# Patient Record
Sex: Male | Born: 1995 | Race: White | Hispanic: No | Marital: Single | Smoking: Never smoker
Health system: Southern US, Community
[De-identification: ages and names within clinical notes are randomized; demographics above are authoritative.]

## PROBLEM LIST (undated history)

## (undated) DIAGNOSIS — F840 Autistic disorder: Secondary | ICD-10-CM

## (undated) DIAGNOSIS — F909 Attention-deficit hyperactivity disorder, unspecified type: Secondary | ICD-10-CM

## (undated) DIAGNOSIS — R011 Cardiac murmur, unspecified: Secondary | ICD-10-CM

---
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
## 2015-01-02 ENCOUNTER — Encounter: Payer: Self-pay | Admitting: Occupational Therapy

## 2015-01-02 ENCOUNTER — Ambulatory Visit: Payer: Medicaid Other | Admitting: Occupational Therapy

## 2015-01-02 DIAGNOSIS — M25642 Stiffness of left hand, not elsewhere classified: Secondary | ICD-10-CM

## 2015-01-02 DIAGNOSIS — R29898 Other symptoms and signs involving the musculoskeletal system: Secondary | ICD-10-CM

## 2015-01-02 NOTE — Therapy (Signed)
[facility] [address] [place], [place], [address] Phone: [phone]   Fax:  [phone]  Occupational Therapy Treatment  Patient Details  Name: Albert Hamilton MRN: [id number] Date of Birth: 1995-05-27 No Data Recorded  Encounter Date: 01/02/2015      OT End of Session - 01/02/15 1050    Visit Number 5   Number of Visits 8   Authorization Type MCD   Authorization Time Period 8 visits approved  through 01/07/15   OT Start Time 1015   OT Stop Time 1045   OT Time Calculation (min) 30 min   Activity Tolerance Patient tolerated treatment well      Past Medical History  Diagnosis Date  . Autism   . ADHD (attention deficit hyperactivity disorder)   . Heart murmur     Past Surgical History  Procedure Laterality Date  . S/p orif lt small metacarpal 10/25/14 Left 10/25/14    There were no vitals filed for this visit.  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 Yes   Pain Score 4   only with exercises   Pain Location Hand   Pain Orientation Left   Pain Descriptors / Indicators Aching   Pain Type Acute pain   Pain Onset More than a month ago   Pain Frequency Intermittent   Aggravating Factors  P/ROM, strengthening, cold weather   Pain Relieving Factors rest, heat            [facility] OT Assessment - 01/02/15 0001    Left Hand AROM   L Ring  MCP 0-90 68 Degrees   L Little  MCP 0-90 73 Degrees   Hand Function   Right Hand Grip (lbs) 87 lbs   Left Hand Grip (lbs) 65 lbs                  OT Treatments/Exercises (OP) - 01/02/15 0001    ADLs   ADL Comments Assessed remaining STG and LTG's and discussed results with patient (see goals). Re-assessed grip strength and ROM with  improvements in both - see assessment   Hand Exercises   Other Hand Exercises Gripper set at 55 lbs resistance to pick up blocks for sustained grip strength, but with compensations (picking up on diagonal) due to max drops and frustration w/o compensation. Updated putty to green resistance and issued; pt performed mass grasp and intrinsic + pinch x 10 reps each.                   OT Short Term Goals - 01/02/15 1049    OT SHORT TERM GOAL #1   Title Independent w/ splint wear and care after 2 weeks wear to ensure proper fit   Baseline issued, may need adjustments/modifications   Time 4   Period Weeks   Status Achieved   OT SHORT TERM GOAL #2   Title Independent with initial HEP   Baseline issued, but will need updates   Time 4   Period Weeks   Status Achieved   OT SHORT TERM GOAL #3   Title Pain less than or equal to 5/10 with exercises   Baseline 4/10 with exercises, no pain at rest  Time 4   Period Weeks   Status Achieved           OT Long Term Goals - 01/02/15 1049    OT LONG TERM GOAL #1   Title Independent w/ updated strengthening HEP    Baseline Dependent d/t current precautions   Time 8   Period Weeks   Status Achieved   OT LONG TERM GOAL #2   Title Pt to demo full composite flexion and extension Lt hand for gripping, grasping and releasing   Baseline approx. 50% flexion, 90% extension   Time 8   Period Weeks   Status Achieved  full flexion and extension   OT LONG TERM GOAL #3   Title Pt to return to using Lt hand as assist for bilateral tasks   Baseline dependent d/t current precautions   Time 8   Period Weeks   Status Achieved   OT LONG TERM GOAL #4   Title Grip strength Lt hand to be 25 lbs or greater    Baseline unable to assess d/t current precautions   Time 8   Period Weeks   Status Achieved  12/20/14: Lt = 50-55 lbs (Rt = 80 lbs). 01/02/15: Lt = 65 lbs (Rt = 87 lbs)               Plan - 01/02/15 1051    Clinical Impression  Statement Pt met all STG's and LTG's. Pt progressing with ROM and strength Lt hand   Plan D/C O.T.    OT Home Exercise Plan 11/02/14: splint wear and care, precautions, and initial A/ROM per protocol; 11/24/14 P/ROM HEP; 12/13/14 Strengthening HEP   Consulted and Agree with Plan of Care Patient       OCCUPATIONAL THERAPY DISCHARGE SUMMARY  Visits from Start of Care: 5  Current functional level related to goals / functional outcomes: SEE ABOVE - Pt met all goals   Remaining deficits: Mild pain Mild Lt hand weakness   Education / Equipment: Splint wear and care, HEP's  Plan: Patient agrees to discharge.  Patient goals were met. Patient is being discharged due to meeting the stated rehab goals.  ?????       Problem List There are no active problems to display for this patient.   Carey Bullocks, OTR/L 01/02/2015, 10:53 AM  [facility] [address] [place], [place], [address] Phone: [phone]   Fax:  [phone]  Name: Albert Hamilton MRN: [id number] Date of Birth: 1995-05-22

## 2016-02-10 ENCOUNTER — Encounter (HOSPITAL_COMMUNITY): Payer: Self-pay | Admitting: *Deleted

## 2016-02-10 ENCOUNTER — Emergency Department (HOSPITAL_COMMUNITY)
Admission: EM | Admit: 2016-02-10 | Discharge: 2016-02-10 | Disposition: A | Discharge status: Home / Self Care | Payer: Worker's Compensation | Attending: Emergency Medicine | Admitting: Emergency Medicine

## 2016-02-10 DIAGNOSIS — F909 Attention-deficit hyperactivity disorder, unspecified type: Secondary | ICD-10-CM | POA: Diagnosis not present

## 2016-02-10 DIAGNOSIS — W268XXA Contact with other sharp object(s), not elsewhere classified, initial encounter: Secondary | ICD-10-CM | POA: Insufficient documentation

## 2016-02-10 DIAGNOSIS — S61032A Puncture wound without foreign body of left thumb without damage to nail, initial encounter: Secondary | ICD-10-CM | POA: Diagnosis not present

## 2016-02-10 DIAGNOSIS — Y9389 Activity, other specified: Secondary | ICD-10-CM | POA: Diagnosis not present

## 2016-02-10 DIAGNOSIS — F840 Autistic disorder: Secondary | ICD-10-CM | POA: Diagnosis not present

## 2016-02-10 DIAGNOSIS — Y990 Civilian activity done for income or pay: Secondary | ICD-10-CM | POA: Diagnosis not present

## 2016-02-10 DIAGNOSIS — T148XXA Other injury of unspecified body region, initial encounter: Secondary | ICD-10-CM

## 2016-02-10 DIAGNOSIS — Y9289 Other specified places as the place of occurrence of the external cause: Secondary | ICD-10-CM | POA: Diagnosis not present

## 2016-02-10 DIAGNOSIS — S6992XA Unspecified injury of left wrist, hand and finger(s), initial encounter: Secondary | ICD-10-CM | POA: Diagnosis present

## 2016-02-10 LAB — RAPID HIV SCREEN (HIV 1/2 AB+AG)
HIV 1/2 ANTIBODIES: NONREACTIVE
HIV-1 P24 Antigen - HIV24: NONREACTIVE

## 2016-02-10 NOTE — ED Provider Notes (Signed)
[facility] DEPT Provider Note   CSN: [id number] Arrival date & time: 02/10/16  0156     History   Chief Complaint Chief Complaint  Patient presents with  . Body Fluid Exposure    HPI Albert Hamilton is a 20 y.o. male.  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 He felt a poke through his glove on his left thumb. He noticed a small puncture wound and reported it to his supervisor. Supervisor states that needles are sometimes found in the plastic but no needle was seen today. Patient is unsure what poked him. There has not been any bleeding. His tetanus shot is up-to-date.   The history is provided by the patient.  Body Fluid Exposure    Past Medical History:  Diagnosis Date  . ADHD (attention deficit hyperactivity disorder)   . Autism   . Heart murmur     There are no active problems to display for this patient.   Past Surgical History:  Procedure Laterality Date  . s/p ORIF Lt small metacarpal 10/25/14 Left 10/25/14       Home Medications    Prior to Admission medications   Medication Sig Start Date End Date Taking? Authorizing Provider  amoxicillin-clavulanate (AUGMENTIN) 875-125 MG per tablet Take 1 tablet by mouth 2 (two) times daily. 10/20/14   Morven N Rumley, DO  FLUoxetine (PROZAC) 10 MG capsule Take 10 mg by mouth daily.    Historical Provider, MD  Melatonin 3 MG TABS Take 1 tablet by mouth daily as needed (sleep).     Historical Provider, MD  oxyCODONE-acetaminophen (PERCOCET/ROXICET) 5-325 MG per tablet Take 1-2 tablets by mouth every 6 (six) hours as needed for moderate pain. 10/20/14   Araceli Bouchealeigh N Rumley, DO    Family History Family History  Problem Relation Age of Onset  . Asthma Mother   . Asthma Father   . Diabetes Other   . Asthma Other     Social History Social History  Substance Use Topics  . Smoking status: Never Smoker  . Smokeless tobacco: Never Used  . Alcohol use No     Allergies   Patient has no known  allergies.   Review of Systems Review of Systems  Constitutional: Negative for activity change, appetite change and fever.  HENT: Negative for congestion and rhinorrhea.   Respiratory: Negative for cough, chest tightness and shortness of breath.   Gastrointestinal: Negative for abdominal pain, nausea and vomiting.  Genitourinary: Negative for dysuria and hematuria.  Musculoskeletal: Negative for arthralgias and myalgias.  Skin: Positive for wound.  Neurological: Negative for dizziness and light-headedness.  A complete 10 system review of systems was obtained and all systems are negative except as noted in the HPI and PMH.     Physical Exam Updated Vital Signs BP 139/79   Pulse 88   Temp 98.5 F (36.9 C) (Oral)   Resp 20   Ht 5\' 11"  (1.803 m)   Wt 287 lb (130.2 kg)   SpO2 100%   BMI 40.03 kg/m   Physical Exam  Constitutional: He is oriented to person, place, and time. He appears well-developed and well-nourished. No distress.  HENT:  Head: Normocephalic and atraumatic.  Mouth/Throat: Oropharynx is clear and moist. No oropharyngeal exudate.  Eyes: Conjunctivae and EOM are normal. Pupils are equal, round, and reactive to light.  Neck: Normal range of motion. Neck supple.  No meningismus.  Cardiovascular: Normal rate, regular rhythm, normal heart sounds and intact distal pulses.   No  murmur heard. Pulmonary/Chest: Effort normal and breath sounds normal. No respiratory distress.  Abdominal: Soft. There is no tenderness. There is no rebound and no guarding.  Musculoskeletal: Normal range of motion. He exhibits no edema or tenderness.  Neurological: He is alert and oriented to person, place, and time. No cranial nerve deficit. He exhibits normal muscle tone. Coordination normal.  No ataxia on finger to nose bilaterally. No pronator drift. 5/5 strength throughout. CN 2-12 intact.Equal grip strength. Sensation intact.   Skin: Skin is warm.  Tiny puncture wound to palmar L thumb.  No bleeding.  Psychiatric: He has a normal mood and affect. His behavior is normal.  Nursing note and vitals reviewed.    ED Treatments / Results  Labs (all labs ordered are listed, but only abnormal results are displayed) Labs Reviewed  RAPID HIV SCREEN (HIV 1/2 AB+AG)  HEPATITIS PANEL, ACUTE    EKG  EKG Interpretation None       Radiology No results found.  Procedures Procedures (including critical care time)  Medications Ordered in ED Medications - No data to display   Initial Impression / Assessment and Plan / ED Course  I have reviewed the triage vital signs and the nursing notes.  Pertinent labs & imaging results that were available during my care of the patient were reviewed by me and considered in my medical decision making (see chart for details).  Clinical Course   Patient poked by unknown object to left thumb. Tetanus is up-to-date. Some concern for possible needle prick but no needle seen.  Risks and benefits of HIV prophylaxis discussed with patient. He declines prophylaxis at this time. is agreeable to obtain body fluid exposure panel.  Advised he should have recheck of HIV and hepatitis panel at PCP's office in 4-6 weeks.   Final Clinical Impressions(s) / ED Diagnoses   Final diagnoses:  Puncture wound    New Prescriptions New Prescriptions   No medications on file     Glynn OctaveStephen Loghan Kurtzman, MD 02/10/16 [phone]

## 2016-02-10 NOTE — Discharge Instructions (Signed)
You declined HIV prophylaxis.  You should followup with your doctor in 4-6 weeks for repeat HIV and hepatitis testing. Return to the ED if you develop new or worsening symptoms.

## 2016-02-10 NOTE — ED Notes (Signed)
ED Provider at bedside. 

## 2016-02-10 NOTE — ED Triage Notes (Addendum)
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

## 2016-02-12 LAB — HEPATITIS PANEL, ACUTE
HCV AB: 0.1 {s_co_ratio} (ref 0.0–0.9)
Hep A IgM: NEGATIVE
Hep B C IgM: NEGATIVE
Hepatitis B Surface Ag: NEGATIVE

## 2016-03-24 ENCOUNTER — Ambulatory Visit (INDEPENDENT_AMBULATORY_CARE_PROVIDER_SITE_OTHER): Payer: Worker's Compensation | Admitting: Family Medicine

## 2016-03-24 ENCOUNTER — Encounter: Payer: Self-pay | Admitting: Family Medicine

## 2016-03-24 VITALS — BP 136/78 | HR 91 | Temp 97.8°F | Ht 71.0 in | Wt 290.0 lb

## 2016-03-24 DIAGNOSIS — S61032A Puncture wound without foreign body of left thumb without damage to nail, initial encounter: Secondary | ICD-10-CM

## 2016-03-24 DIAGNOSIS — W460XXA Contact with hypodermic needle, initial encounter: Secondary | ICD-10-CM

## 2016-03-24 DIAGNOSIS — T148XXA Other injury of unspecified body region, initial encounter: Secondary | ICD-10-CM

## 2016-03-24 NOTE — Patient Instructions (Signed)
Great to meet you!  We will call within 1 week with your lab results.

## 2016-03-24 NOTE — Progress Notes (Signed)
   HPI  Patient presents today or Worker's Compensation follow-up.  Patient was seen in the emergency room on 02/10/2016 for a puncture wound in the left thumb on the same date. There is concern for needle puncture/body fluid exposure. HIV and hepatitis labs were drawn and were negative. Patient has healed completely and returned to work the same day. He's had no limitations due to the injury.  He returns for repeat blood work.   PMH: ADHD, autism, heart murmur Family history positive for asthma in father and mother Surgical history of ORIF of the left small metacarpal No alcohol or drug use- social history ROS: Per HPI  Objective: BP 136/78   Pulse 91   Temp 97.8 F (36.6 C) (Oral)   Ht 5\' 11"  (1.803 m)   Wt 290 lb (131.5 kg)   BMI 40.45 kg/m  Gen: NAD, alert, cooperative with exam HEENT: NCAT CV: RRR, good S1/S2, no murmur Resp: CTABL, no wheezes, non-laboredy Ext: No edema, warm Neuro: Alert and oriented, No gross deficits  Skin: Normal pulmar left thumb  Assessment and plan:  # Puncture wound Complete resolution, repeating labs of possible needle stick/body fluid exposure No limitations for work    Orders Placed This Encounter  Procedures  . HIV antibody (with reflex)  . Hepatitis panel, acute    Murtis SinkSam Linville Decarolis, MD [facility] 03/24/2016, 8:16 AM

## 2016-03-25 ENCOUNTER — Telehealth: Payer: Self-pay | Admitting: Family Medicine

## 2016-03-25 LAB — HEPATITIS PANEL, ACUTE
HEP B S AG: NEGATIVE
Hep A IgM: NEGATIVE
Hep B C IgM: NEGATIVE
Hep C Virus Ab: <0.1 s/co ratio (ref 0.0–0.9)

## 2016-03-25 LAB — HIV ANTIBODY (ROUTINE TESTING W REFLEX): HIV Screen 4th Generation wRfx: NONREACTIVE

## 2016-03-25 NOTE — Telephone Encounter (Signed)
Aware. 

## 2017-06-11 IMAGING — DX DG HAND COMPLETE 3+V*L*
3 series · 3 of 3 positions shown · non-contrast
Comparison: None.

CLINICAL DATA: Bit by a dog.  Multiple puncture wounds.

EXAM:
LEFT HAND - COMPLETE 3+ VIEW; LEFT WRIST - COMPLETE 3+ VIEW

[hand pa]
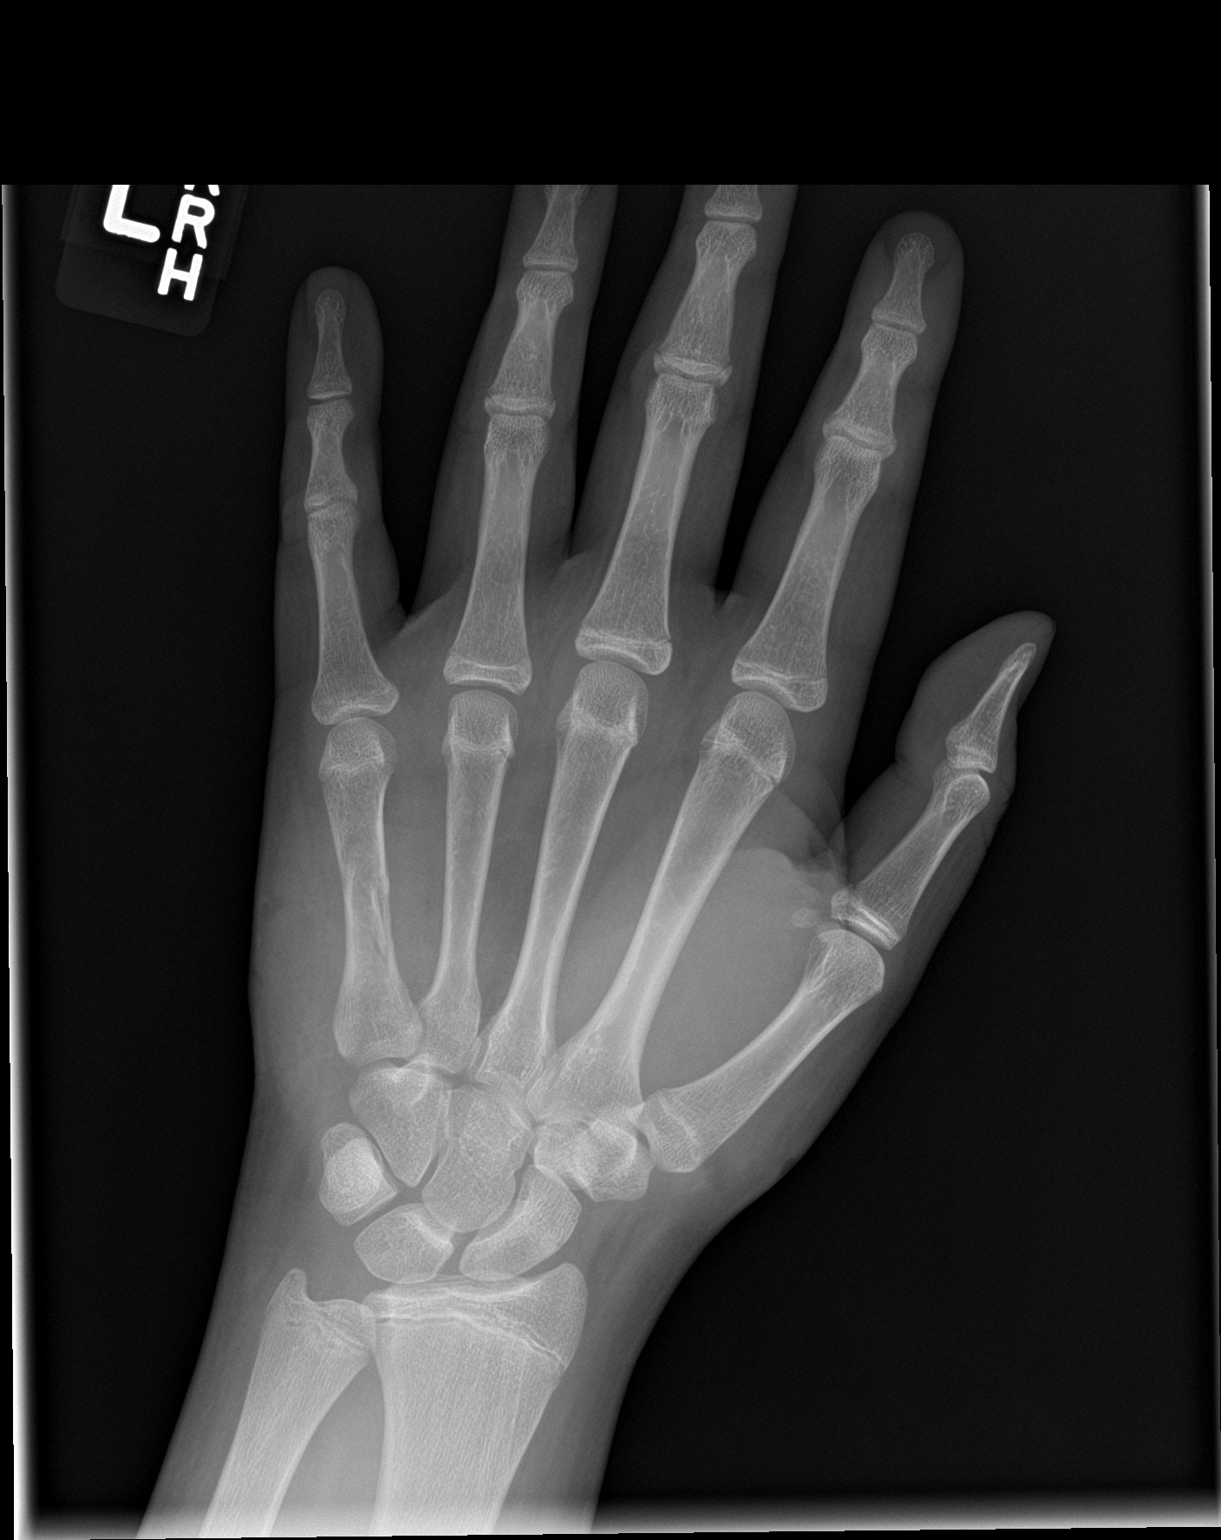

[hand obl]
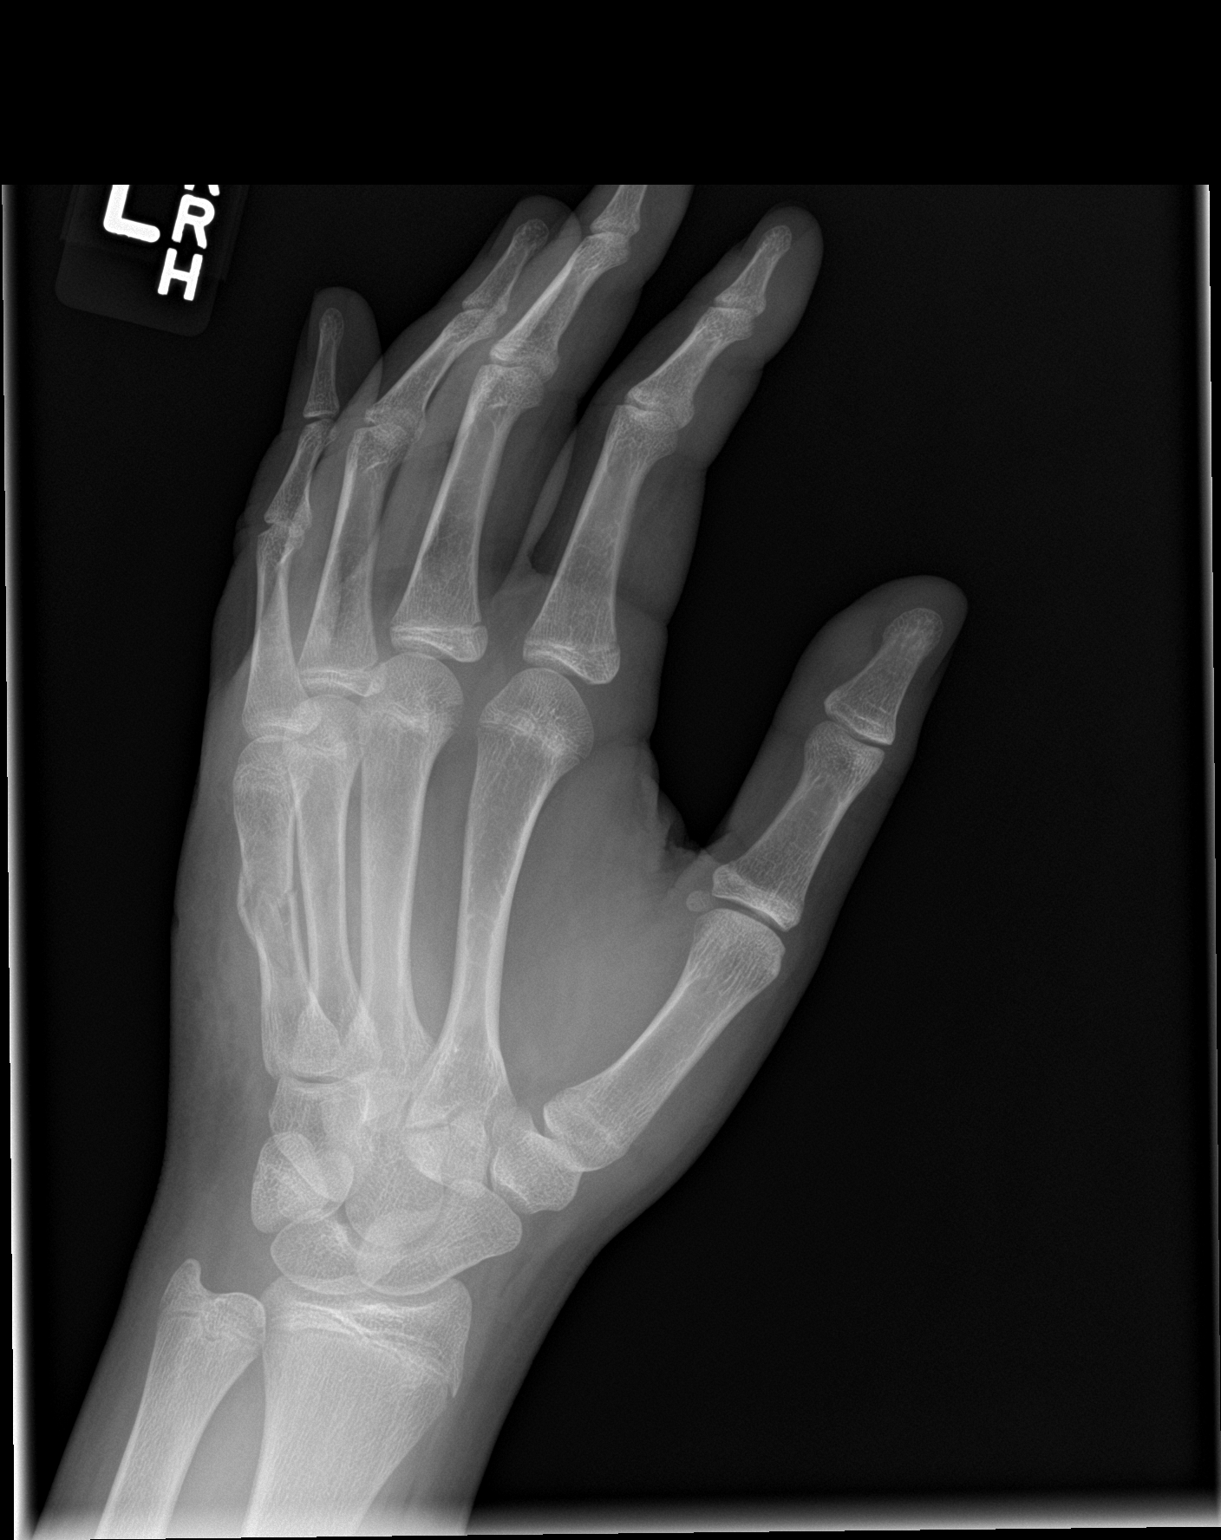

[hand lat]
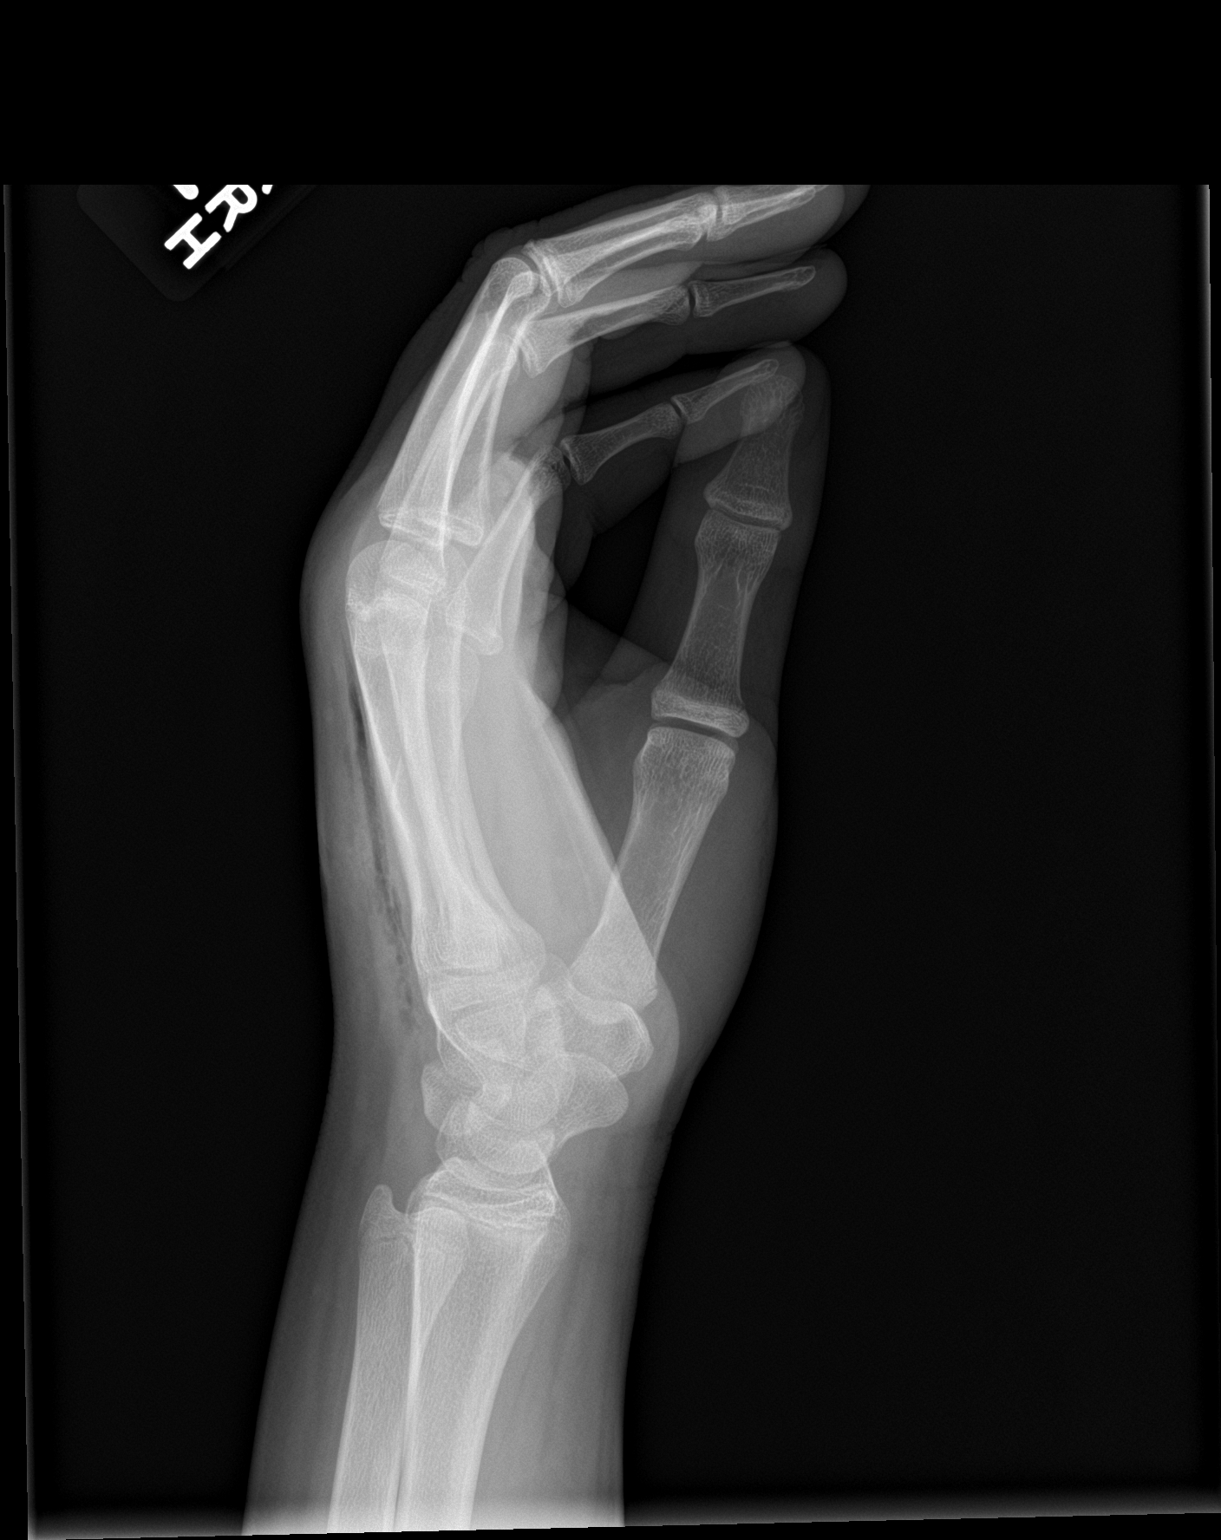

[3 of 3 positions shown; findings below may reference images not displayed]

FINDINGS: There is a comminuted but relatively nondisplaced fracture involving
the fifth metacarpal shaft with overlying soft tissue swelling, skin
wounds and air in the soft tissues.

The joint spaces are maintained. The physeal plates appear symmetric
and normal. No other fractures are identified.
IMPRESSION: Comminuted open fracture involving the fifth metacarpal shaft.

## 2017-06-11 IMAGING — DX DG WRIST COMPLETE 3+V*L*
4 series · 4 of 4 positions shown · non-contrast
Comparison: None.

CLINICAL DATA: Bit by a dog.  Multiple puncture wounds.

EXAM:
LEFT HAND - COMPLETE 3+ VIEW; LEFT WRIST - COMPLETE 3+ VIEW

[wrist pa]
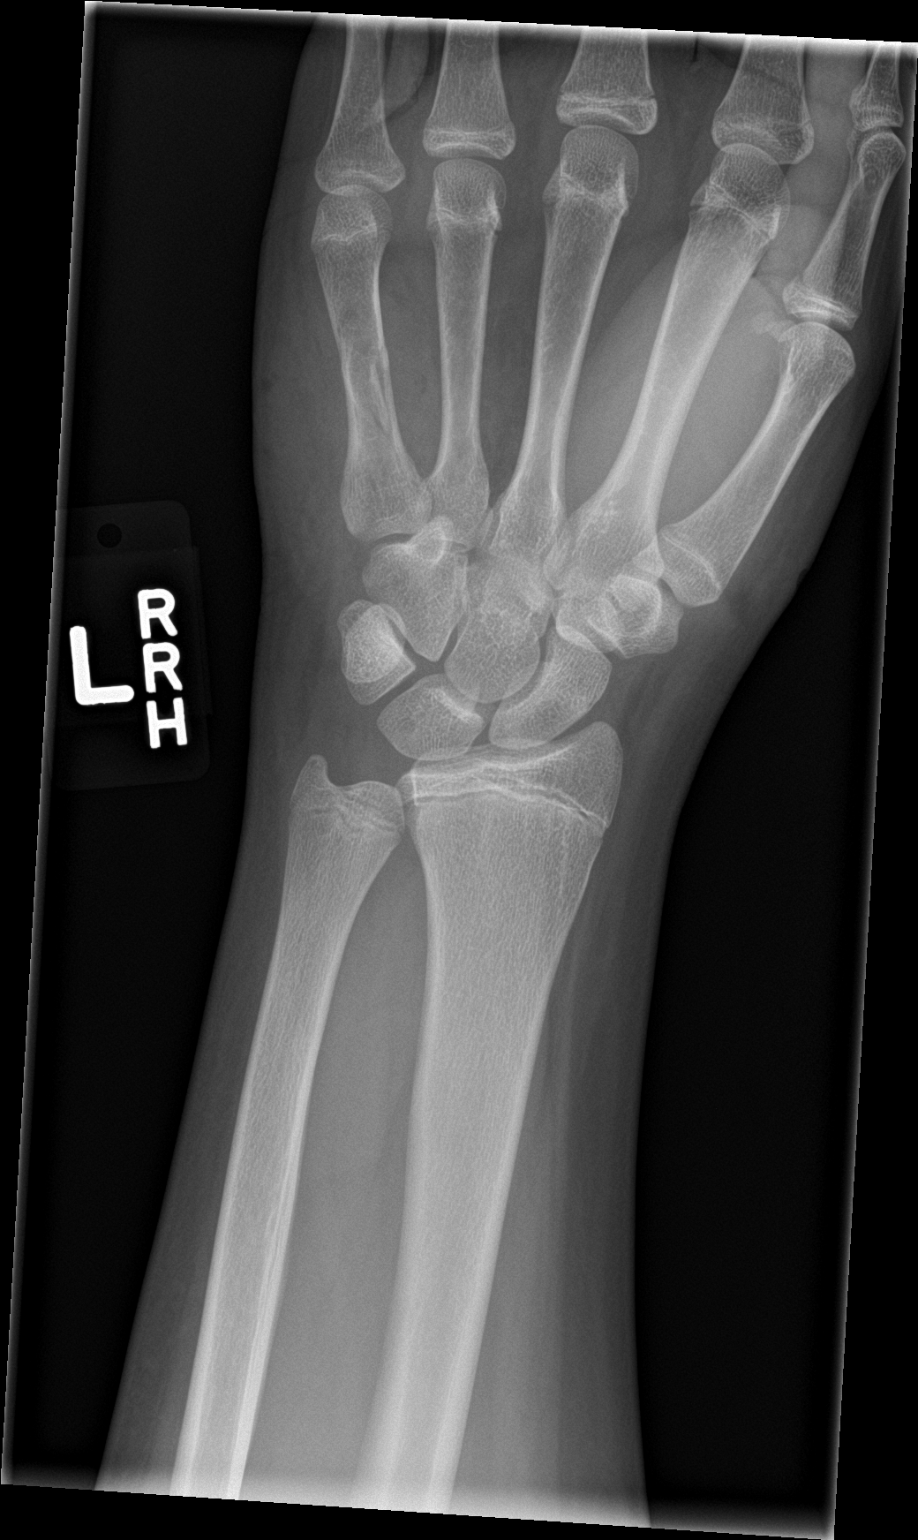

[wrist obl]
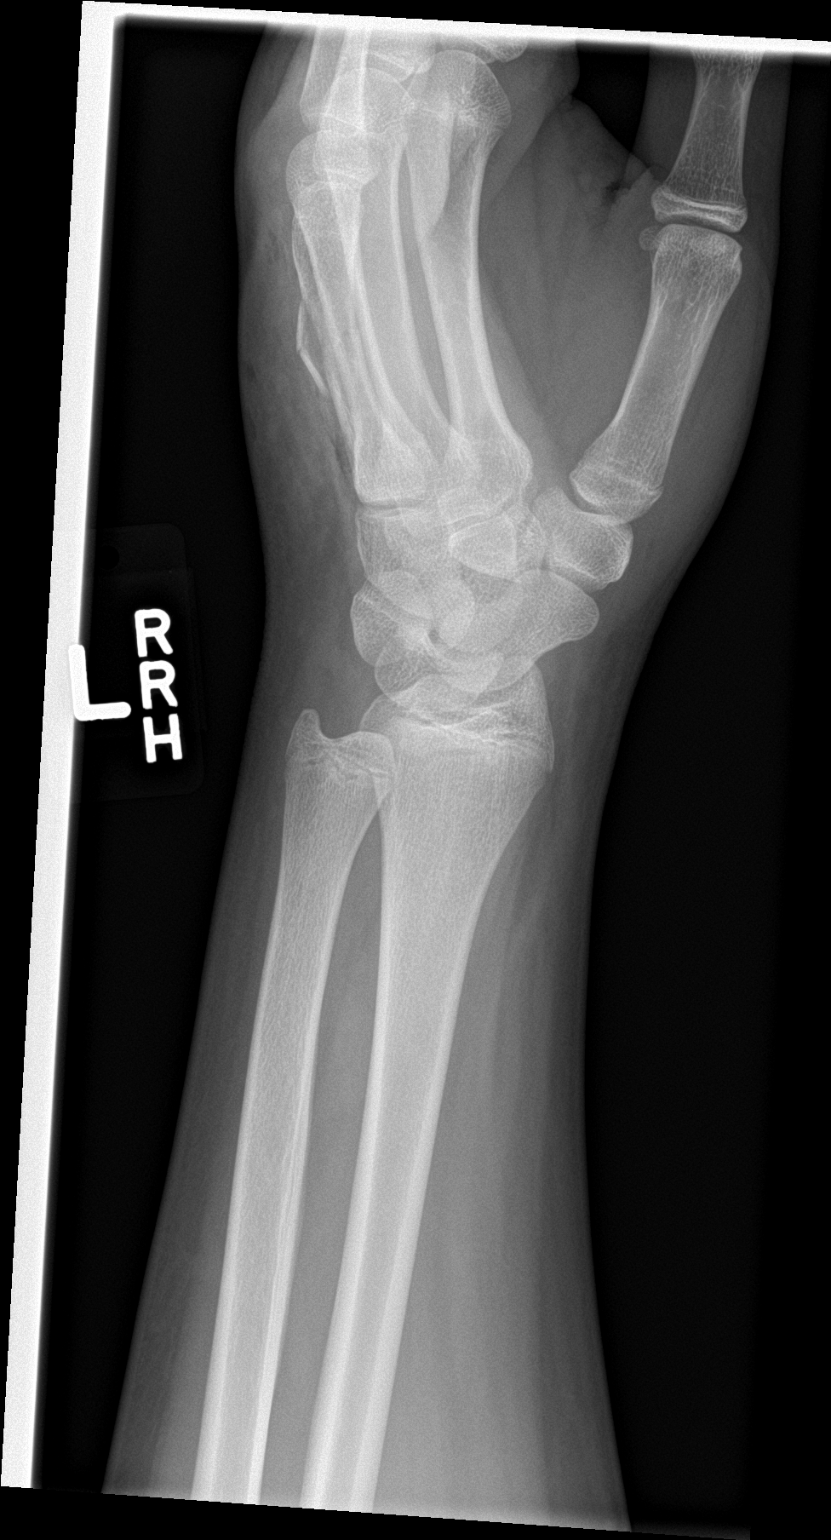

[wrist lat]
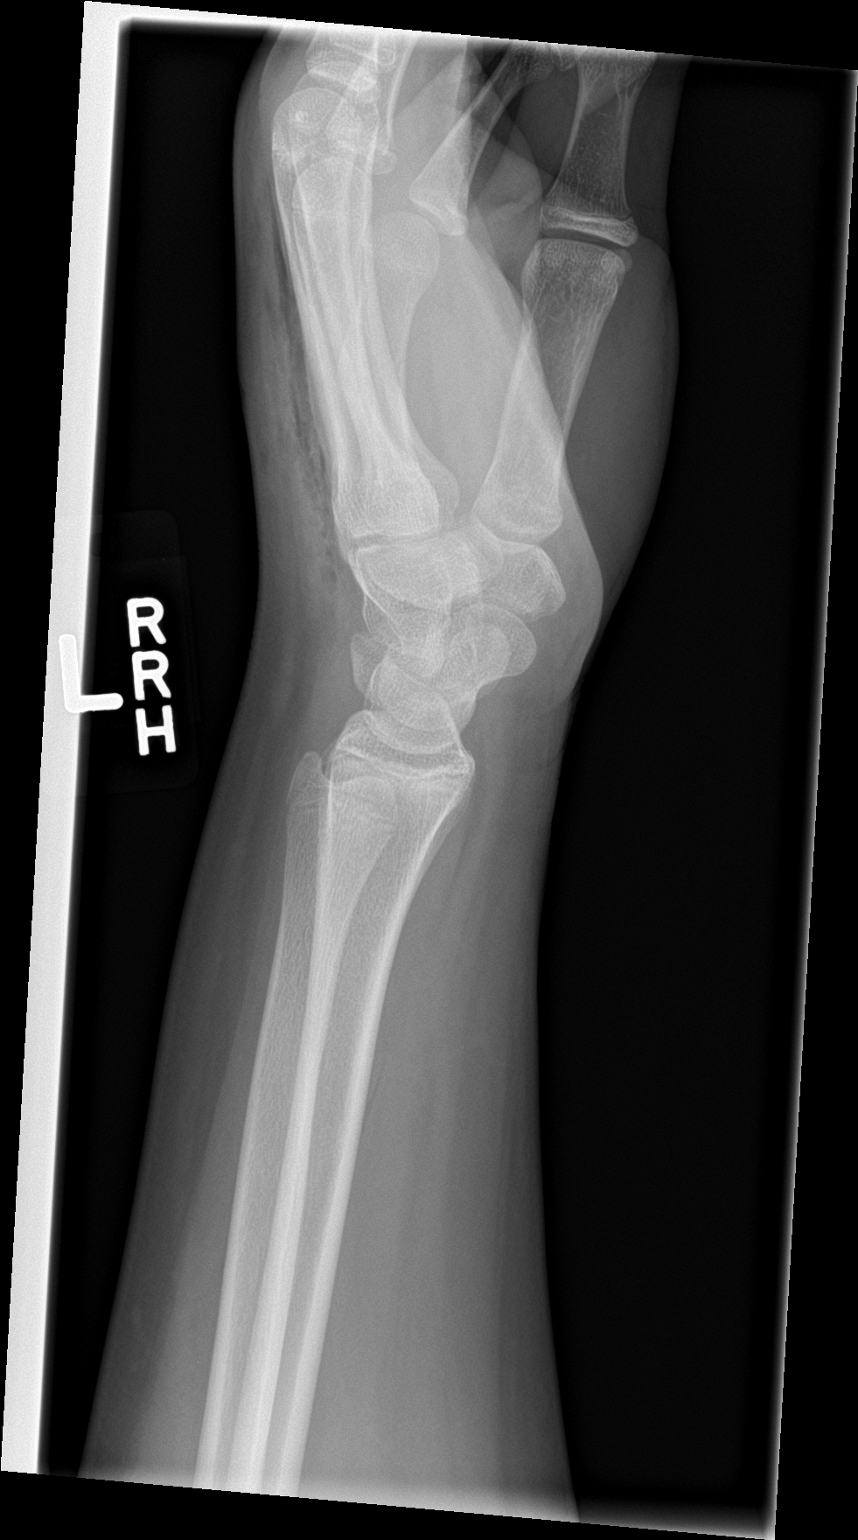

[wrist navicular]
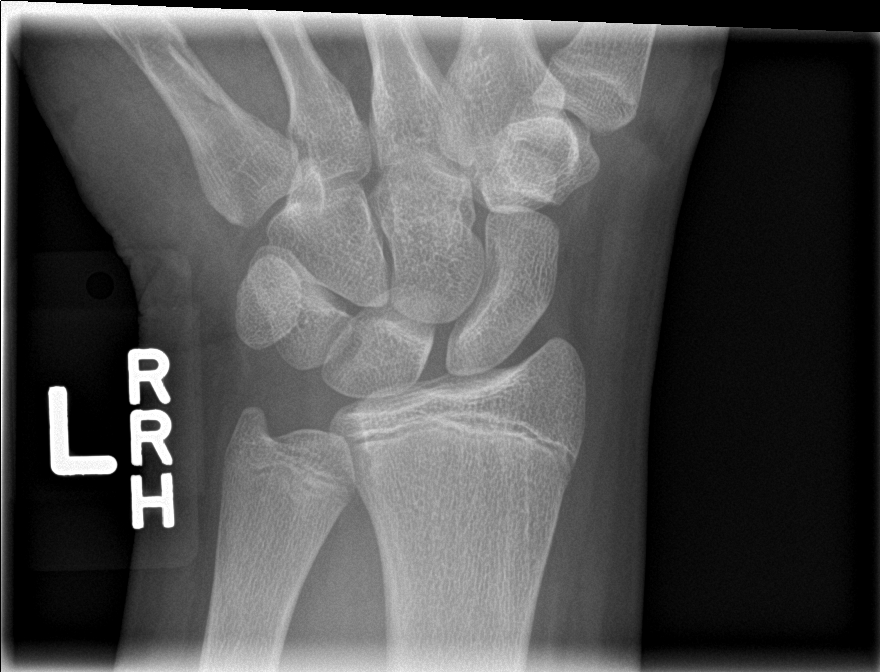

[4 of 4 positions shown; findings below may reference images not displayed]

FINDINGS: There is a comminuted but relatively nondisplaced fracture involving
the fifth metacarpal shaft with overlying soft tissue swelling, skin
wounds and air in the soft tissues.

The joint spaces are maintained. The physeal plates appear symmetric
and normal. No other fractures are identified.
IMPRESSION: Comminuted open fracture involving the fifth metacarpal shaft.

## 2019-01-18 ENCOUNTER — Other Ambulatory Visit: Payer: Self-pay

## 2019-01-18 DIAGNOSIS — Z20822 Contact with and (suspected) exposure to covid-19: Secondary | ICD-10-CM

## 2019-01-19 LAB — NOVEL CORONAVIRUS, NAA: SARS-CoV-2, NAA: NOT DETECTED

## 2019-01-20 ENCOUNTER — Telehealth: Payer: Self-pay | Admitting: *Deleted

## 2019-01-20 NOTE — Telephone Encounter (Signed)
Pt called to get his covid-19 test results. He is advised that he is negative. He voiced understanding. He stated that he got tested because of someone at work tested positive. He denies having symptoms and understands what symptoms that can come.

## 2019-02-16 ENCOUNTER — Other Ambulatory Visit: Payer: Self-pay

## 2019-02-16 DIAGNOSIS — Z20822 Contact with and (suspected) exposure to covid-19: Secondary | ICD-10-CM

## 2019-02-17 LAB — NOVEL CORONAVIRUS, NAA: SARS-CoV-2, NAA: NOT DETECTED
